# Patient Record
Sex: Male | Born: 1980 | Race: White | Hispanic: No | Marital: Single | Smoking: Current every day smoker
Health system: Southern US, Community
[De-identification: ages and names within clinical notes are randomized; demographics above are authoritative.]

## PROBLEM LIST (undated history)

## (undated) DIAGNOSIS — I10 Essential (primary) hypertension: Secondary | ICD-10-CM

## (undated) DIAGNOSIS — E7800 Pure hypercholesterolemia, unspecified: Secondary | ICD-10-CM

---

## 2004-06-16 ENCOUNTER — Other Ambulatory Visit: Payer: Self-pay

## 2004-06-16 ENCOUNTER — Emergency Department: Payer: Self-pay | Admitting: Emergency Medicine

## 2005-05-11 ENCOUNTER — Emergency Department: Payer: Self-pay | Admitting: Unknown Physician Specialty

## 2006-01-04 ENCOUNTER — Emergency Department: Payer: Self-pay | Admitting: Emergency Medicine

## 2007-06-03 ENCOUNTER — Emergency Department: Payer: Self-pay | Admitting: Emergency Medicine

## 2007-06-05 ENCOUNTER — Other Ambulatory Visit: Payer: Self-pay

## 2007-06-05 ENCOUNTER — Emergency Department: Payer: Self-pay | Admitting: Emergency Medicine

## 2007-07-01 ENCOUNTER — Emergency Department: Payer: Self-pay | Admitting: Unknown Physician Specialty

## 2007-08-07 ENCOUNTER — Emergency Department: Payer: Self-pay | Admitting: Emergency Medicine

## 2008-01-21 ENCOUNTER — Emergency Department: Payer: Self-pay | Admitting: Emergency Medicine

## 2008-02-08 ENCOUNTER — Emergency Department: Payer: Self-pay | Admitting: Internal Medicine

## 2008-04-22 ENCOUNTER — Inpatient Hospital Stay: Payer: Self-pay | Admitting: Unknown Physician Specialty

## 2008-04-22 ENCOUNTER — Inpatient Hospital Stay: Payer: Self-pay | Admitting: Internal Medicine

## 2008-06-16 ENCOUNTER — Emergency Department: Payer: Self-pay | Admitting: Emergency Medicine

## 2008-07-11 ENCOUNTER — Emergency Department: Payer: Self-pay | Admitting: Emergency Medicine

## 2009-08-24 ENCOUNTER — Emergency Department: Payer: Self-pay | Admitting: Unknown Physician Specialty

## 2010-07-02 ENCOUNTER — Emergency Department: Payer: Self-pay | Admitting: Emergency Medicine

## 2011-03-20 ENCOUNTER — Emergency Department: Payer: Self-pay | Admitting: *Deleted

## 2011-04-12 ENCOUNTER — Emergency Department: Payer: Self-pay | Admitting: Emergency Medicine

## 2011-07-19 ENCOUNTER — Emergency Department: Payer: Self-pay | Admitting: Emergency Medicine

## 2011-07-19 LAB — DRUG SCREEN, URINE
Amphetamines, Ur Screen: NEGATIVE (ref ?–1000)
Cannabinoid 50 Ng, Ur ~~LOC~~: POSITIVE (ref ?–50)
Cocaine Metabolite,Ur ~~LOC~~: NEGATIVE (ref ?–300)
MDMA (Ecstasy)Ur Screen: NEGATIVE (ref ?–500)

## 2011-07-19 LAB — CBC
HGB: 14.4 g/dL (ref 13.0–18.0)
MCH: 31.7 pg (ref 26.0–34.0)
MCHC: 33.7 g/dL (ref 32.0–36.0)
RBC: 4.56 10*6/uL (ref 4.40–5.90)
RDW: 13.4 % (ref 11.5–14.5)
WBC: 13.6 10*3/uL — ABNORMAL HIGH (ref 3.8–10.6)

## 2011-07-19 LAB — URINALYSIS, COMPLETE
Bacteria: NONE SEEN
Bilirubin,UR: NEGATIVE
Blood: NEGATIVE
Glucose,UR: NEGATIVE mg/dL (ref 0–75)
Ketone: NEGATIVE
Leukocyte Esterase: NEGATIVE
Protein: NEGATIVE
RBC,UR: NONE SEEN /HPF (ref 0–5)
Specific Gravity: 1.003 (ref 1.003–1.030)
Squamous Epithelial: NONE SEEN

## 2011-07-19 LAB — ETHANOL: Ethanol: <3 mg/dL

## 2011-07-19 LAB — COMPREHENSIVE METABOLIC PANEL
Albumin: 4.6 g/dL (ref 3.4–5.0)
Anion Gap: 7 (ref 7–16)
BUN: 11 mg/dL (ref 7–18)
Bilirubin,Total: 0.3 mg/dL (ref 0.2–1.0)
Calcium, Total: 9.3 mg/dL (ref 8.5–10.1)
Chloride: 106 mmol/L (ref 98–107)
Co2: 26 mmol/L (ref 21–32)
Creatinine: 0.67 mg/dL (ref 0.60–1.30)
EGFR (Non-African Amer.): >60
Osmolality: 277 (ref 275–301)
Potassium: 3.9 mmol/L (ref 3.5–5.1)
SGOT(AST): 27 U/L (ref 15–37)
Sodium: 139 mmol/L (ref 136–145)
Total Protein: 8 g/dL (ref 6.4–8.2)

## 2011-07-19 LAB — TSH: Thyroid Stimulating Horm: 0.605 u[IU]/mL

## 2011-07-19 LAB — SALICYLATE LEVEL: Salicylates, Serum: 4.1 mg/dL — ABNORMAL HIGH

## 2011-07-19 LAB — ACETAMINOPHEN LEVEL: Acetaminophen: <2 ug/mL

## 2011-10-26 ENCOUNTER — Emergency Department: Payer: Self-pay | Admitting: Emergency Medicine

## 2011-12-15 ENCOUNTER — Ambulatory Visit: Payer: Self-pay | Admitting: Family Medicine

## 2012-02-18 ENCOUNTER — Ambulatory Visit: Payer: Self-pay

## 2012-07-14 ENCOUNTER — Ambulatory Visit: Payer: Self-pay | Admitting: Emergency Medicine

## 2014-06-22 ENCOUNTER — Emergency Department: Payer: Self-pay | Admitting: Emergency Medicine

## 2014-08-11 NOTE — Consult Note (Signed)
PATIENT NAME:  Dylan SellarERRY, Hollister L MR#:  [id number] DATE OF BIRTH:  1980/04/26  DATE OF CONSULTATION:  06/23/2014  REFERRING PHYSICIAN:   CONSULTING PHYSICIAN:  Malyiah Fellows K. Guss Bundehalla, MD  PL[facility] DICTATION[facility] EMERGENCY ROOM, BHU-3   SUBJECTIVE: The patient [facility]. Staff reported that he had been cooperative. The patient reports that he already feels better since he had his lithium carbonate 300 mg last night. This appears to be rather a placebo effect. The patient is eager to go home and go back to his girlfriend who is a Teacher, early years/prepharmacist. The patient  will be given lithium carbonate tonight, tonight's dose right now, and that he will be given an appointment back to see [facility] staff tomorrow; that is, 06/24/2014, and he should go and get his prescription for lithium. I asked them to write it for generic as he is not able to afford brand name medications and he agreed to the same.  OBJECTIVE: Dressed in hospital scrubs. Alert and oriented, calm, pleasant and cooperative. Cheerful and smiling. Had a good night's rest. Appetite is good. Does not appear to be responding to internal stimuli. Denies suicidal or homicidal plans. Denies feeling hopeless or helpless. Insight and judgment fair and adequate. Contracts for safety and eager to go home.  IMPRESSION: Bipolar disorder, depressed. The patient is feeling better on lithium carbonate 300 mg at bedtime, though he got only 1 dose. He is eager to go home.  PLAN: Discontinue IBC involuntary commitment and discharge home and the patient will be given an appointment to see the mental health clinic staff tomorrow; that is, 06/24/2014.  ____________________________ Jannet MantisSurya K. Guss Bundehalla, MD skc:lm D: 06/23/2014 16:14:00 ET T: 06/23/2014 22:03:04 ET JOB#: [id number]  cc: Monika SalkSurya K. Guss Bundehalla, MD, <Dictator> Beau FannySURYA K Stoy Fenn MD ELECTRONICALLY SIGNED 06/29/2014 16:13

## 2014-08-11 NOTE — Consult Note (Signed)
PATIENT NAME:  Dylan Hayes, Dylan Hayes DATE OF BIRTH:  03-17-1981  DATE OF CONSULTATION:  06/22/2014  CONSULTING PHYSICIAN:  Allanna Bresee K. Guss Bundehalla, MD  [facility] DI[facility]ATION[facility] [place].  AGE: 34 years.  SEX: Male.  RACE: White.  SUBJECTIVE: The patient was seen in consultation at [facility], BHU-3. 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 The patient is single, never married, and has a girlfriend who is 33 years old and employed as a LawyerCNA at hospice, and they live together. The patient comes to [facility] Emergency Room with the chief complaint "I was on lithium 300 mg at bedtime and it levels me off and makes me feel fine, and I'm not on that."  HISTORY OF PRESENT ILLNESS: The patient reports that he is being followed at [facility] and he has been on Celexa and Abilify, which do not help him as much, and lithium helped him the best.   ALCOHOL AND DRUGS: Does not have problems with alcohol drinking. Does admit smoking THC at the rate of a bowl a day and whenever he can afford and have it. Does admit smoking nicotine cigarettes at a rate of 1/2 pack a day for many years.   MENTAL STATUS: The patient was seen lying in bed, alert and oriented. Calm, pleasant, and cooperative, no agitation. Affect is appropriate with his mood, which is low and down. And admits he feels low and down when he is not on lithium, and lithium really levels his mind off. Does not appear to be responding to internal stimuli. Cognition intact. General information is fair. Denies suicidal or homicidal plans. Insight and judgment guarded. Impulse control is fair.   IMPRESSION: Bipolar disorder and depressed.  RECOMMENDATIONS: Start the patient on lithium 300 mg SA at bedtime. We will see how the patient does and consider for discharge tomorrow, that is  06/23/2014, if the patient continues to stay stable and rests by tonight. We will refer him for appropriate followup appointment on Monday morning with outpatient psychiatry.   ____________________________ Jannet MantisSurya K. Guss Bundehalla, MD skc:mw D: 06/22/2014 17:18:00 ET T: 06/22/2014 17:49:15 ET JOB#: [id number]  cc: Monika SalkSurya K. Guss Bundehalla, MD, <Dictator> Beau FannySURYA K Kaitlan Bin MD ELECTRONICALLY SIGNED 06/23/2014 16:35

## 2017-08-01 ENCOUNTER — Emergency Department
Admission: EM | Admit: 2017-08-01 | Discharge: 2017-08-01 | Disposition: A | Discharge status: Home / Self Care | Payer: Medicaid Other | Attending: Emergency Medicine | Admitting: Emergency Medicine

## 2017-08-01 ENCOUNTER — Encounter: Payer: Self-pay | Admitting: Emergency Medicine

## 2017-08-01 ENCOUNTER — Other Ambulatory Visit: Payer: Self-pay

## 2017-08-01 ENCOUNTER — Emergency Department: Payer: Medicaid Other

## 2017-08-01 DIAGNOSIS — Y929 Unspecified place or not applicable: Secondary | ICD-10-CM | POA: Insufficient documentation

## 2017-08-01 DIAGNOSIS — Y999 Unspecified external cause status: Secondary | ICD-10-CM | POA: Insufficient documentation

## 2017-08-01 DIAGNOSIS — F1210 Cannabis abuse, uncomplicated: Secondary | ICD-10-CM | POA: Insufficient documentation

## 2017-08-01 DIAGNOSIS — R45851 Suicidal ideations: Secondary | ICD-10-CM | POA: Diagnosis not present

## 2017-08-01 DIAGNOSIS — Y9355 Activity, bike riding: Secondary | ICD-10-CM | POA: Diagnosis not present

## 2017-08-01 DIAGNOSIS — Z79899 Other long term (current) drug therapy: Secondary | ICD-10-CM | POA: Insufficient documentation

## 2017-08-01 DIAGNOSIS — F329 Major depressive disorder, single episode, unspecified: Secondary | ICD-10-CM | POA: Insufficient documentation

## 2017-08-01 DIAGNOSIS — F17200 Nicotine dependence, unspecified, uncomplicated: Secondary | ICD-10-CM | POA: Insufficient documentation

## 2017-08-01 DIAGNOSIS — M25511 Pain in right shoulder: Secondary | ICD-10-CM

## 2017-08-01 DIAGNOSIS — F1994 Other psychoactive substance use, unspecified with psychoactive substance-induced mood disorder: Secondary | ICD-10-CM | POA: Insufficient documentation

## 2017-08-01 DIAGNOSIS — S4991XA Unspecified injury of right shoulder and upper arm, initial encounter: Secondary | ICD-10-CM

## 2017-08-01 DIAGNOSIS — F331 Major depressive disorder, recurrent, moderate: Secondary | ICD-10-CM | POA: Diagnosis not present

## 2017-08-01 DIAGNOSIS — F4010 Social phobia, unspecified: Secondary | ICD-10-CM

## 2017-08-01 DIAGNOSIS — F32A Depression, unspecified: Secondary | ICD-10-CM

## 2017-08-01 LAB — URINE DRUG SCREEN, QUALITATIVE (ARMC ONLY)
Amphetamines, Ur Screen: POSITIVE — AB
Barbiturates, Ur Screen: NOT DETECTED
Benzodiazepine, Ur Scrn: NOT DETECTED
Cannabinoid 50 Ng, Ur ~~LOC~~: POSITIVE — AB
Cocaine Metabolite,Ur ~~LOC~~: NOT DETECTED
MDMA (Ecstasy)Ur Screen: NOT DETECTED
Methadone Scn, Ur: NOT DETECTED
Opiate, Ur Screen: NOT DETECTED
Phencyclidine (PCP) Ur S: NOT DETECTED
Tricyclic, Ur Screen: NOT DETECTED

## 2017-08-01 LAB — CBC
HCT: 40.3 % (ref 40.0–52.0)
Hemoglobin: 13.9 g/dL (ref 13.0–18.0)
MCH: 31.2 pg (ref 26.0–34.0)
MCHC: 34.6 g/dL (ref 32.0–36.0)
MCV: 90.2 fL (ref 80.0–100.0)
Platelets: 300 K/uL (ref 150–440)
RBC: 4.47 MIL/uL (ref 4.40–5.90)
RDW: 13 % (ref 11.5–14.5)
WBC: 14.3 K/uL — ABNORMAL HIGH (ref 3.8–10.6)

## 2017-08-01 LAB — COMPREHENSIVE METABOLIC PANEL WITH GFR
ALT: 16 U/L — ABNORMAL LOW (ref 17–63)
AST: 22 U/L (ref 15–41)
Albumin: 4.9 g/dL (ref 3.5–5.0)
Alkaline Phosphatase: 66 U/L (ref 38–126)
Anion gap: 8 (ref 5–15)
BUN: 12 mg/dL (ref 6–20)
CO2: 27 mmol/L (ref 22–32)
Calcium: 9.5 mg/dL (ref 8.9–10.3)
Chloride: 102 mmol/L (ref 101–111)
Creatinine, Ser: 0.58 mg/dL — ABNORMAL LOW (ref 0.61–1.24)
GFR calc Af Amer: >60 mL/min
GFR calc non Af Amer: >60 mL/min
Glucose, Bld: 130 mg/dL — ABNORMAL HIGH (ref 65–99)
Potassium: 3.6 mmol/L (ref 3.5–5.1)
Sodium: 137 mmol/L (ref 135–145)
Total Bilirubin: 0.4 mg/dL (ref 0.3–1.2)
Total Protein: 8.1 g/dL (ref 6.5–8.1)

## 2017-08-01 LAB — ETHANOL: Alcohol, Ethyl (B): <10 mg/dL (ref <10)

## 2017-08-01 LAB — SALICYLATE LEVEL: Salicylate Lvl: <7 mg/dL (ref 2.8–30.0)

## 2017-08-01 LAB — ACETAMINOPHEN LEVEL: Acetaminophen (Tylenol), Serum: <10 ug/mL — ABNORMAL LOW (ref 10–30)

## 2017-08-01 MED ORDER — IBUPROFEN 800 MG PO TABS
800.0000 mg | ORAL_TABLET | Freq: Once | ORAL | Status: AC
  Administered 2017-08-01: 800 mg via ORAL
  Filled 2017-08-01: qty 1

## 2017-08-01 MED ORDER — HYDROCODONE-ACETAMINOPHEN 5-325 MG PO TABS
1.0000 | ORAL_TABLET | Freq: Once | ORAL | Status: AC
  Administered 2017-08-01: 1 via ORAL
  Filled 2017-08-01: qty 1

## 2017-08-01 MED ORDER — SERTRALINE HCL 25 MG PO TABS
25.0000 mg | ORAL_TABLET | Freq: Every day | ORAL | Status: DC
  Filled 2017-08-01: qty 1

## 2017-08-01 MED ORDER — FLUOXETINE HCL 20 MG PO CAPS
20.0000 mg | ORAL_CAPSULE | Freq: Every day | ORAL | 1 refills | Status: DC
Start: 2017-08-01 — End: 2019-12-22

## 2017-08-01 NOTE — Consult Note (Signed)
[name] Face-to-Face Psychiatry Consult   Reason for Consult: Consult for 37 year old man who came to the emergency room initially for medical problems but then reported some suicidal thought. Referring Physician: Rip Harbour Patient Identification: Dylan Hayes MRN:  [id number] Principal Diagnosis: Moderate recurrent major depression ([place]) Diagnosis:   Patient Active Problem List   Diagnosis Date Noted  . Moderate recurrent major depression ([place]) [F33.1] 08/01/2017  . Social anxiety disorder [F40.10] 08/01/2017    Total Time spent with patient: 1 hour  Subjective:   Dylan Hayes is a 37 y.o. male patient admitted with "basically to get back on my medicine".  HPI: Patient interviewed chart reviewed.  37 year old man reports that his mood has been depressed and down.  Feels negative and a lot of the time.  Feels like he has trouble getting his thoughts together.  Low motivation.  Cannot hold a job because he cannot concentrate.  Gets very nervous and panicky when he is around crowds of people.  Reports he has had passive suicidal thoughts but no recent intention or plan.  Denies any hallucinations.  Not currently receiving any mental health treatment major stresses.  Has been living homeless for weeks now.  Social history: Has been living intermittently on the street recently.  Cannot keep a job.  Somewhat alienated from his family.  Medical history: No ongoing chronic medical issues  Substance abuse history: Uses marijuana intermittently but denies alcohol use or any other current drug abuse  Past Psychiatric History: Patient has seen psychiatrist in the past.  Last had a hospitalization a few years ago.  Positive past history of a suicide attempt also years ago.  Used to take medication but cannot remember what it was.  Chart suggests it might of been Zoloft.  No history of manic symptoms.  Vague reports of past hallucinations.  Risk to Self: Suicidal Ideation: No-Not Currently/Within Last 6  Months Suicidal Intent: No-Not Currently/Within Last 6 Months Is patient at risk for suicide?: Yes Suicidal Plan?: No-Not Currently/Within Last 6 Months Access to Means: Yes Specify Access to Suicidal Means: Medication What has been your use of drugs/alcohol within the last 12 months?: Cannabis How many times?: 2 Other Self Harm Risks: Reports of none Triggers for Past Attempts: Other personal contacts, Other (Comment) Intentional Self Injurious Behavior: None Risk to Others: Homicidal Ideation: No Thoughts of Harm to Others: No Current Homicidal Intent: No Current Homicidal Plan: No Access to Homicidal Means: No Identified Victim: Reports of none History of harm to others?: No Violent Behavior Description: Reports of none Does patient have access to weapons?: No Criminal Charges Pending?: No Does patient have a court date: No Prior Inpatient Therapy: Prior Inpatient Therapy: Yes Prior Therapy Dates: 04/2008 Prior Therapy Facilty/Provider(s): [facility] Reason for Treatment: Depression Prior Outpatient Therapy: Prior Outpatient Therapy: Yes Prior Therapy Dates: 2013 Prior Therapy Facilty/Provider(s): [facility] Reason for Treatment: Depression Does patient have an ACCT team?: No Does patient have Intensive In-House Services?  : No Does patient have [facility] services? : No Does patient have P4CC services?: No  Past Medical History: History reviewed. No pertinent past medical history. History reviewed. No pertinent surgical history. Family History: No family history on file. Family Psychiatric  History: Positive history of depression in his mother Social History:  Social History   Substance and Sexual Activity  Alcohol Use Not Currently  . Frequency: Never     Social History   Substance and Sexual Activity  Drug Use Yes  . Types: Marijuana  Social History   Socioeconomic History  . Marital status: Single    Spouse name: Not on file  . Number of  children: Not on file  . Years of education: Not on file  . Highest education level: Not on file  Occupational History  . Not on file  Social Needs  . Financial resource strain: Not on file  . Food insecurity:    Worry: Not on file    Inability: Not on file  . Transportation needs:    Medical: Not on file    Non-medical: Not on file  Tobacco Use  . Smoking status: Current Some Day Smoker  Substance and Sexual Activity  . Alcohol use: Not Currently    Frequency: Never  . Drug use: Yes    Types: Marijuana  . Sexual activity: Not on file  Lifestyle  . Physical activity:    Days per week: Not on file    Minutes per session: Not on file  . Stress: Not on file  Relationships  . Social connections:    Talks on phone: Not on file    Gets together: Not on file    Attends religious service: Not on file    Active member of club or organization: Not on file    Attends meetings of clubs or organizations: Not on file    Relationship status: Not on file  Other Topics Concern  . Not on file  Social History Narrative  . Not on file   Additional Social History:    Allergies:  No Known Allergies  Labs:  Results for orders placed or performed during the hospital encounter of 08/01/17 (from the past 48 hour(s))  Comprehensive metabolic panel     Status: Abnormal   Collection Time: 08/01/17  4:02 AM  Result Value Ref Range   Sodium 137 135 - 145 mmol/L   Potassium 3.6 3.5 - 5.1 mmol/L   Chloride 102 101 - 111 mmol/L   CO2 27 22 - 32 mmol/L   Glucose, Bld 130 (H) 65 - 99 mg/dL   BUN 12 6 - 20 mg/dL   Creatinine, Ser 0.58 (L) 0.61 - 1.24 mg/dL   Calcium 9.5 8.9 - 10.3 mg/dL   Total Protein 8.1 6.5 - 8.1 g/dL   Albumin 4.9 3.5 - 5.0 g/dL   AST 22 15 - 41 U/L   ALT 16 (L) 17 - 63 U/L   Alkaline Phosphatase 66 38 - 126 U/L   Total Bilirubin 0.4 0.3 - 1.2 mg/dL   GFR calc non Af Amer >60 >60 mL/min   GFR calc Af Amer >60 >60 mL/min    Comment: (NOTE) The eGFR has been calculated  using the CKD EPI equation. This calculation has not been validated in all clinical situations. eGFR's persistently <60 mL/min signify possible Chronic Kidney Disease.    Anion gap 8 5 - 15    Comment: Performed at [facility], [address] [place], [place] [address]  Ethanol     Status: None   Collection Time: 08/01/17  4:02 AM  Result Value Ref Range   Alcohol, Ethyl (B) <10 <10 mg/dL    Comment:        LOWEST DETECTABLE LIMIT FOR SERUM ALCOHOL IS 10 mg/dL FOR MEDICAL PURPOSES ONLY Performed at [facility], [address] [place],  [address]   Salicylate level     Status: None   Collection Time: 08/01/17  4:02 AM  Result Value Ref Range  Salicylate Lvl <6.8 2.8 - 30.0 mg/dL    Comment: Performed at [facility], [address] [place], [place] [address]  Acetaminophen level     Status: Abnormal   Collection Time: 08/01/17  4:02 AM  Result Value Ref Range   Acetaminophen (Tylenol), Serum <10 (L) 10 - 30 ug/mL    Comment:        THERAPEUTIC CONCENTRATIONS VARY SIGNIFICANTLY. A RANGE OF 10-30 ug/mL MAY BE AN EFFECTIVE CONCENTRATION FOR MANY PATIENTS. HOWEVER, SOME ARE BEST TREATED AT CONCENTRATIONS OUTSIDE THIS RANGE. ACETAMINOPHEN CONCENTRATIONS >150 ug/mL AT 4 HOURS AFTER INGESTION AND >50 ug/mL AT 12 HOURS AFTER INGESTION ARE OFTEN ASSOCIATED WITH TOXIC REACTIONS. Performed at [facility], [address] [place], [place] [address]   cbc     Status: Abnormal   Collection Time: 08/01/17  4:02 AM  Result Value Ref Range   WBC 14.3 (H) 3.8 - 10.6 K/uL   RBC 4.47 4.40 - 5.90 MIL/uL   Hemoglobin 13.9 13.0 - 18.0 g/dL   HCT 40.3 40.0 - 52.0 %   MCV 90.2 80.0 - 100.0 fL   MCH 31.2 26.0 - 34.0 pg   MCHC 34.6 32.0 - 36.0 g/dL   RDW 13.0 11.5 - 14.5 %   Platelets 300 150 - 440 K/uL    Comment: Performed at [facility], [address] [place], [place] [address]  Urine Drug Screen, Qualitative     Status:  Abnormal   Collection Time: 08/01/17  4:02 AM  Result Value Ref Range   Tricyclic, Ur Screen NONE DETECTED NONE DETECTED   Amphetamines, Ur Screen POSITIVE (A) NONE DETECTED   MDMA (Ecstasy)Ur Screen NONE DETECTED NONE DETECTED   Cocaine Metabolite,Ur [place] NONE DETECTED NONE DETECTED   Opiate, Ur Screen NONE DETECTED NONE DETECTED   Phencyclidine (PCP) Ur S NONE DETECTED NONE DETECTED   Cannabinoid 50 Ng, Ur [place] POSITIVE (A) NONE DETECTED   Barbiturates, Ur Screen NONE DETECTED NONE DETECTED   Benzodiazepine, Ur Scrn NONE DETECTED NONE DETECTED   Methadone Scn, Ur NONE DETECTED NONE DETECTED    Comment: (NOTE) Tricyclics + metabolites, urine    Cutoff 1000 ng/mL Amphetamines + metabolites, urine  Cutoff 1000 ng/mL MDMA (Ecstasy), urine              Cutoff 500 ng/mL Cocaine Metabolite, urine          Cutoff 300 ng/mL Opiate + metabolites, urine        Cutoff 300 ng/mL Phencyclidine (PCP), urine         Cutoff 25 ng/mL Cannabinoid, urine                 Cutoff 50 ng/mL Barbiturates + metabolites, urine  Cutoff 200 ng/mL Benzodiazepine, urine              Cutoff 200 ng/mL Methadone, urine                   Cutoff 300 ng/mL The urine drug screen provides only a preliminary, unconfirmed analytical test result and should not be used for non-medical purposes. Clinical consideration and professional judgment should be applied to any positive drug screen result due to possible interfering substances. A more specific alternate chemical method must be used in order to obtain a confirmed analytical result. Gas chromatography / mass spectrometry (GC/MS) is the preferred confirmat ory method. Performed at [facility], [address], [place],  [address]     Current Facility-Administered Medications  Medication Dose Route Frequency Provider Last Rate Last Dose  . sertraline (ZOLOFT) tablet 25 mg  25 mg Oral QHS Paulette Blanch, MD       Current Outpatient Medications   Medication Sig Dispense Refill  . buprenorphine-naloxone (SUBOXONE) 8-2 mg SUBL SL tablet Place 1 tablet under the tongue 2 (two) times daily.    Marland Kitchen FLUoxetine (PROZAC) 20 MG capsule Take 1 capsule (20 mg total) by mouth daily. 30 capsule 1    Musculoskeletal: Strength & Muscle Tone: within normal limits Gait & Station: normal Patient leans: N/A  Psychiatric Specialty Exam: Physical Exam  Nursing note and vitals reviewed. Constitutional: He appears well-developed and well-nourished.  HENT:  Head: Normocephalic and atraumatic.  Eyes: Pupils are equal, round, and reactive to light. Conjunctivae are normal.  Neck: Normal range of motion.  Cardiovascular: Regular rhythm and normal heart sounds.  Respiratory: Effort normal. No respiratory distress.  GI: Soft.  Musculoskeletal: Normal range of motion.  Neurological: He is alert.  Skin: Skin is warm and dry.  Psychiatric: Judgment normal. His affect is blunt. His speech is delayed. He is slowed. Thought content is not paranoid. Cognition and memory are normal. He expresses no homicidal and no suicidal ideation.    Review of Systems  Constitutional: Negative.   HENT: Negative.   Eyes: Negative.   Respiratory: Negative.   Cardiovascular: Negative.   Gastrointestinal: Negative.   Musculoskeletal: Negative.   Skin: Negative.   Neurological: Negative.   Psychiatric/Behavioral: Positive for depression. Negative for hallucinations, memory loss, substance abuse and suicidal ideas. The patient is nervous/anxious and has insomnia.     Blood pressure 124/83, pulse 66, temperature 98.5 F (36.9 C), temperature source Oral, resp. rate 19, height '5\' 8"'  (1.727 m), weight 72.6 kg (160 lb), SpO2 99 %.Body mass index is 24.33 kg/m.  General Appearance: Casual  Eye Contact:  Good  Speech:  Clear and Coherent  Volume:  Decreased  Mood:  Anxious and Depressed  Affect:  Blunt  Thought Process:  Goal Directed  Orientation:  Full (Time, Place, and  Person)  Thought Content:  Logical  Suicidal Thoughts:  No  Homicidal Thoughts:  No  Memory:  Immediate;   Fair Recent;   Fair Remote;   Fair  Judgement:  Fair  Insight:  Fair  Psychomotor Activity:  Decreased  Concentration:  Concentration: Fair  Recall:  [employer] of Knowledge:  Fair  Language:  Fair  Akathisia:  No  Handed:  Right  AIMS (if indicated):     Assets:  Desire for Improvement Physical Health Resilience  ADL's:  Impaired  Cognition:  WNL  Sleep:        Treatment Plan Summary: Medication management and Plan 37 year old man who has a history of depression and social anxiety disorder.  Had made some passive suicidal comments earlier but denies that he has any intention or plan of acting on it.  Patient had not has been cooperative and calm in the emergency room.  Does not want to stay in the hospital.  Does not meet commitment criteria.  Discontinue IVC.  Reviewed appropriate treatment and suggested we start him on serotonin reuptake inhibitor using an effective medicine that also will be affordable.  Prozac 20 mg a day prescription with one month refill written.  Patient agreeable.  Side effects reviewed.  Case reviewed with emergency room physician and TTS.  Patient can be discharged with follow-up at [facility].  Disposition: No evidence of imminent risk to self or  others at present.   Patient does not meet criteria for psychiatric inpatient admission. Supportive therapy provided about ongoing stressors. Discussed crisis plan, support from social network, calling 911, coming to the Emergency Department, and calling Suicide Hotline.  Alethia Berthold, MD 08/01/2017 3:51 PM

## 2017-08-01 NOTE — ED Notes (Addendum)
Patient came in for right shoulder injury and then told nurse he was having thoughts of harming himself. Patient states he has been on medication for depression in the past but it has been years since patient has been on anything.

## 2017-08-01 NOTE — ED Notes (Signed)
TTS to bedside. 

## 2017-08-01 NOTE — ED Notes (Signed)
Pt provided lunch tray.

## 2017-08-01 NOTE — ED Notes (Signed)
Patient presents to the ED by himself;"I walked here; I'm homeless x 3 months; I want to get back on my medicine and stuff; I have suicidal thoughts; I always have suicidal thoughts; I'm not getting  therapy."
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
## 2017-08-01 NOTE — Discharge Instructions (Addendum)
Take your Prozac as prescribed.  Follow-up with [facility] for your depression.  You can use Motrin 3 of the over-the-counter pills 3 times a day for the pain in the shoulder.  Please also follow-up with orthopedic surgery Dr. Hyacinth MeekerMiller.  Return for any further problems.

## 2017-08-01 NOTE — ED Notes (Signed)
Patient is currently speaking with [facility]. 

## 2017-08-01 NOTE — ED Notes (Signed)
Psychiatry to bedside at this time. 
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
## 2019-05-18 ENCOUNTER — Ambulatory Visit: Payer: Medicaid Other | Attending: Internal Medicine

## 2019-05-18 DIAGNOSIS — Z20822 Contact with and (suspected) exposure to covid-19: Secondary | ICD-10-CM

## 2019-05-19 LAB — NOVEL CORONAVIRUS, NAA: SARS-CoV-2, NAA: DETECTED — AB

## 2019-05-24 ENCOUNTER — Telehealth: Payer: Self-pay

## 2019-05-24 NOTE — Telephone Encounter (Signed)
Patient called and left message on voicemail to return call.

## 2019-05-24 NOTE — Telephone Encounter (Signed)
Pt returning call regarding his covid test results. Will have nurse call back.   Joycelyn Rua Hopkins

## 2019-06-04 ENCOUNTER — Telehealth: Payer: Self-pay | Admitting: *Deleted

## 2019-06-04 NOTE — Telephone Encounter (Signed)
Patient was diagnosed with COVID on 2/10 and patient had exposure with symptoms: cough, headache, breathing difficulty and congestion. Patient has had improvement- but he still has symptoms. Patient has family who has now tested + COVID as well. Patient has been contacted by the Health dept.  Advised patient if he needs documentation for work/court- he may need to be seen by virtual visit/UC for extension of isolation due to symptoms. Patient will contact health dept for more documentation and he has lab print out with recommendations for MyChart-. Patient voices understanding.
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
## 2019-12-22 ENCOUNTER — Ambulatory Visit
Admission: EM | Admit: 2019-12-22 | Discharge: 2019-12-22 | Disposition: A | Discharge status: Home / Self Care | Payer: Medicaid Other | Attending: Family | Admitting: Family

## 2019-12-22 ENCOUNTER — Encounter: Payer: Self-pay | Admitting: Emergency Medicine

## 2019-12-22 ENCOUNTER — Other Ambulatory Visit: Payer: Self-pay

## 2019-12-22 DIAGNOSIS — Z882 Allergy status to sulfonamides status: Secondary | ICD-10-CM | POA: Insufficient documentation

## 2019-12-22 DIAGNOSIS — Z79899 Other long term (current) drug therapy: Secondary | ICD-10-CM | POA: Insufficient documentation

## 2019-12-22 DIAGNOSIS — R030 Elevated blood-pressure reading, without diagnosis of hypertension: Secondary | ICD-10-CM | POA: Insufficient documentation

## 2019-12-22 DIAGNOSIS — R519 Headache, unspecified: Secondary | ICD-10-CM

## 2019-12-22 DIAGNOSIS — R059 Cough, unspecified: Secondary | ICD-10-CM

## 2019-12-22 DIAGNOSIS — R110 Nausea: Secondary | ICD-10-CM | POA: Diagnosis not present

## 2019-12-22 DIAGNOSIS — F17210 Nicotine dependence, cigarettes, uncomplicated: Secondary | ICD-10-CM | POA: Insufficient documentation

## 2019-12-22 DIAGNOSIS — Z20822 Contact with and (suspected) exposure to covid-19: Secondary | ICD-10-CM | POA: Diagnosis not present

## 2019-12-22 DIAGNOSIS — R05 Cough: Secondary | ICD-10-CM | POA: Diagnosis not present

## 2019-12-22 DIAGNOSIS — R0981 Nasal congestion: Secondary | ICD-10-CM | POA: Diagnosis not present

## 2019-12-22 MED ORDER — DICLOFENAC SODIUM 75 MG PO TBEC: 75.0000 mg | DELAYED_RELEASE_TABLET | Freq: Two times a day (BID) | ORAL | 0 refills | Status: AC | PRN

## 2019-12-22 MED ORDER — ONDANSETRON HCL 4 MG PO TABS: 4.0000 mg | ORAL_TABLET | Freq: Three times a day (TID) | ORAL | 0 refills | Status: AC | PRN

## 2019-12-22 NOTE — ED Triage Notes (Signed)
Patient c/o HAs, bodyaches, cough, and nasal congestion for 4 days.  Patient denies fevers.

## 2019-12-22 NOTE — ED Provider Notes (Signed)
[facility]    CSN: [id number] Arrival date & time: 12/22/19  1348      History   Chief Complaint Chief Complaint  Patient presents with  . Headache  . Cough  . Generalized Body Aches    HPI Dylan Hayes is a 39 y.o. male.   39 year old male presents with nasal congestion, cough, body aches and headache for the past 4 days. Denies any fever but has felt "warm". Also having some nausea but no sore throat, vomiting or diarrhea. Has taken Ibuprofen 800mg  and Fioricet for the headache with minimal relief. He has [place] about 7 [place]. He has not been vaccinated. He has previously tested positive and had symptoms of  COVID 19 in February 2021. Other chronic health issues include substance use disorder and currently on Suboxone daily.   The history is provided by the patient.    History reviewed. No pertinent past medical history.  Patient Active Problem List   Diagnosis Date Noted  . Moderate recurrent major depression (HCC) 08/01/2017  . Social anxiety disorder 08/01/2017    History reviewed. No pertinent surgical history.     Home Medications    Prior to Admission medications   Medication Sig Start Date End Date Taking? Authorizing Provider  buprenorphine-naloxone (SUBOXONE) 8-2 mg SUBL SL tablet Place 1 tablet under the tongue 2 (two) times daily.   Yes [provider]  diclofenac (VOLTAREN) 75 MG EC tablet Take 1 tablet (75 mg total) by mouth every 12 (twelve) hours as needed for moderate pain. 12/22/19   02/21/20, NP  ondansetron (ZOFRAN) 4 MG tablet Take 1 tablet (4 mg total) by mouth every 8 (eight) hours as needed for nausea. 12/22/19   02/21/20, NP  FLUoxetine (PROZAC) 20 MG capsule Take 1 capsule (20 mg total) by mouth daily. 08/01/17 12/22/19  Clapacs, 02/21/20, MD    Family History History reviewed. No pertinent family history.  Social History Social History   Tobacco  Use  . Smoking status: Current Some Day Smoker    Packs/day: 0.50    Types: Cigarettes  . Smokeless tobacco: Never Used  Vaping Use  . Vaping Use: Never used  Substance Use Topics  . Alcohol use: Not Currently  . Drug use: Not Currently    Types: Marijuana     Allergies   Sulfa antibiotics   Review of Systems Review of Systems  Constitutional: Positive for activity change, appetite change, chills and fatigue. Negative for fever.  HENT: Positive for congestion, postnasal drip, rhinorrhea and sinus pressure. Negative for ear discharge, ear pain, facial swelling, mouth sores, nosebleeds, sinus pain, sore throat and trouble swallowing.   Eyes: Negative for pain, discharge, redness and itching.  Respiratory: Positive for cough. Negative for chest tightness and wheezing.   Gastrointestinal: Positive for nausea. Negative for diarrhea and vomiting.  Musculoskeletal: Positive for arthralgias and myalgias. Negative for neck pain and neck stiffness.  Skin: Negative for color change, rash and wound.  Allergic/Immunologic: Negative for environmental allergies, food allergies and immunocompromised state.  Neurological: Positive for headaches. Negative for dizziness, seizures, syncope, weakness, light-headedness and numbness.  Hematological: Negative for adenopathy. Does not bruise/bleed easily.     Physical Exam Triage Vital Signs ED Triage Vitals  Enc Vitals Group     BP 12/22/19 1414 (!) 153/105     Pulse Rate 12/22/19 1414 85  Resp 12/22/19 1414 16     Temp 12/22/19 1414 98.1 F (36.7 C)     Temp Source 12/22/19 1414 Oral     SpO2 12/22/19 1414 99 %     Weight 12/22/19 1410 174 lb (78.9 kg)     Height 12/22/19 1410 5\' 8"  (1.727 m)     Head Circumference --      Peak Flow --      Pain Score 12/22/19 1410 6     Pain Loc --      Pain Edu? --      Excl. in GC? --    No data found.  Updated Vital Signs BP (!) 153/105 (BP Location: Right Arm)   Pulse 85   Temp 98.1 F  (36.7 C) (Oral)   Resp 16   Ht 5\' 8"  (1.727 m)   Wt 174 lb (78.9 kg)   SpO2 99%   BMI 26.46 kg/m   Visual Acuity Right Eye Distance:   Left Eye Distance:   Bilateral Distance:    Right Eye Near:   Left Eye Near:    Bilateral Near:     Physical Exam Vitals and nursing note reviewed.  Constitutional:      General: He is awake. He is not in acute distress.    Appearance: He is well-developed and well-groomed. He is ill-appearing.     Comments: He is sitting on the exam table in no acute distress but appears tired and ill.   HENT:     Head: Normocephalic and atraumatic.     Right Ear: Hearing, tympanic membrane, ear canal and external ear normal.     Left Ear: Hearing, tympanic membrane, ear canal and external ear normal.     Nose: Septal deviation and congestion present.     Right Sinus: No maxillary sinus tenderness or frontal sinus tenderness.     Left Sinus: No maxillary sinus tenderness or frontal sinus tenderness.     Mouth/Throat:     Lips: Pink.     Mouth: Mucous membranes are moist.     Pharynx: Oropharynx is clear. Uvula midline. Posterior oropharyngeal erythema present. No pharyngeal swelling, oropharyngeal exudate or uvula swelling.  Eyes:     Extraocular Movements: Extraocular movements intact.     Conjunctiva/sclera: Conjunctivae normal.     Pupils: Pupils are equal, round, and reactive to light.  Cardiovascular:     Rate and Rhythm: Normal rate and regular rhythm.     Heart sounds: Normal heart sounds. No murmur heard.   Pulmonary:     Effort: Pulmonary effort is normal. No respiratory distress.     Breath sounds: Normal breath sounds and air entry. No decreased air movement. No decreased breath sounds, wheezing, rhonchi or rales.  Musculoskeletal:        General: Normal range of motion.     Cervical back: Normal range of motion and neck supple. No rigidity.  Lymphadenopathy:     Cervical: No cervical adenopathy.  Skin:    General: Skin is warm and dry.       Capillary Refill: Capillary refill takes less than 2 seconds.     Findings: No rash.  Neurological:     General: No focal deficit present.     Mental Status: He is alert and oriented to person, place, and time.  Psychiatric:        Mood and Affect: Mood normal.        Behavior: Behavior normal. Behavior is cooperative.  Thought Content: Thought content normal.        Judgment: Judgment normal.      UC Treatments / Results  Labs (all labs ordered are listed, but only abnormal results are displayed) Labs Reviewed  SARS CORONAVIRUS 2 (TAT 6-24 HRS)    EKG   Radiology No results found.  Procedures Procedures (including critical care time)  Medications Ordered in UC Medications - No data to display  Initial Impression / Assessment and Plan / UC Course  I have reviewed the triage vital signs and the nursing notes.  Pertinent labs & imaging results that were available during my care of the patient were reviewed by me and considered in my medical decision making (see chart for details).    Reviewed with patient that he probably has a viral illness which is causing the headache. May trial Voltaren 75mg  twice a day as directed for headache. May also take Zofran 4mg  every 8 hours as needed for nausea. Increase fluid intake to help loosen up mucus in sinuses and chest. Rest. Stay at home. Note written for work. Also briefly discussed elevated blood pressure reading. Recommend continue to monitor and see his PCP if BP remains >140/>90. Follow-up pending COVID 19 test results.   Final Clinical Impressions(s) / UC Diagnoses   Final diagnoses:  Nasal congestion  Acute intractable headache, unspecified headache type  Cough  Nausea without vomiting  Elevated blood-pressure reading without diagnosis of hypertension     Discharge Instructions     Recommend start Voltaren 75mg  twice a day as needed for headache. May take Zofran 4mg  every 8 hours as needed for nausea.  Continue to push fluids to stay hydrated and to help loosen up mucus in sinuses and chest. Rest. Stay at home. Follow-up pending COVID 19 test results.     ED Prescriptions    Medication Sig Dispense Auth. Provider   diclofenac (VOLTAREN) 75 MG EC tablet Take 1 tablet (75 mg total) by mouth every 12 (twelve) hours as needed for moderate pain. 20 tablet , NP   ondansetron (ZOFRAN) 4 MG tablet Take 1 tablet (4 mg total) by mouth every 8 (eight) hours as needed for nausea. 15 tablet Emy Angevine, , NP     PDMP not reviewed this encounter.   , NP 12/23/19 [phone]

## 2019-12-22 NOTE — Discharge Instructions (Addendum)
Recommend start Voltaren 75mg  twice a day as needed for headache. May take Zofran 4mg  every 8 hours as needed for nausea. Continue to push fluids to stay hydrated and to help loosen up mucus in sinuses and chest. Rest. Stay at home. Follow-up pending COVID 19 test results.

## 2019-12-23 LAB — SARS CORONAVIRUS 2 (TAT 6-24 HRS): SARS Coronavirus 2: NEGATIVE

## 2023-05-03 ENCOUNTER — Other Ambulatory Visit: Payer: Self-pay

## 2023-05-03 ENCOUNTER — Emergency Department
Admission: EM | Admit: 2023-05-03 | Discharge: 2023-05-03 | Discharge status: Against Medical Advice | Payer: Medicaid Other | Attending: Emergency Medicine | Admitting: Emergency Medicine

## 2023-05-03 ENCOUNTER — Emergency Department: Payer: Medicaid Other

## 2023-05-03 DIAGNOSIS — Z5321 Procedure and treatment not carried out due to patient leaving prior to being seen by health care provider: Secondary | ICD-10-CM | POA: Diagnosis not present

## 2023-05-03 DIAGNOSIS — M25571 Pain in right ankle and joints of right foot: Secondary | ICD-10-CM | POA: Diagnosis present

## 2023-05-03 DIAGNOSIS — W19XXXA Unspecified fall, initial encounter: Secondary | ICD-10-CM | POA: Diagnosis not present

## 2023-05-03 NOTE — ED Triage Notes (Signed)
Pt reports he fell tonight injuring right ankle, pt reports pain and swelling to area.

## 2023-05-17 ENCOUNTER — Emergency Department: Payer: Medicaid Other

## 2023-05-17 ENCOUNTER — Other Ambulatory Visit: Payer: Self-pay

## 2023-05-17 ENCOUNTER — Emergency Department
Admission: EM | Admit: 2023-05-17 | Discharge: 2023-05-17 | Disposition: A | Discharge status: Home / Self Care | Payer: Medicaid Other | Attending: Emergency Medicine | Admitting: Emergency Medicine

## 2023-05-17 DIAGNOSIS — R0789 Other chest pain: Secondary | ICD-10-CM | POA: Insufficient documentation

## 2023-05-17 DIAGNOSIS — I10 Essential (primary) hypertension: Secondary | ICD-10-CM | POA: Diagnosis not present

## 2023-05-17 DIAGNOSIS — R079 Chest pain, unspecified: Secondary | ICD-10-CM | POA: Diagnosis present

## 2023-05-17 HISTORY — DX: Essential (primary) hypertension: I10

## 2023-05-17 LAB — TROPONIN I (HIGH SENSITIVITY)
Troponin I (High Sensitivity): 4 ng/L (ref <18)
Troponin I (High Sensitivity): 4 ng/L (ref <18)

## 2023-05-17 LAB — CBC
HCT: 34.5 % — ABNORMAL LOW (ref 39.0–52.0)
Hemoglobin: 11.5 g/dL — ABNORMAL LOW (ref 13.0–17.0)
MCH: 29.9 pg (ref 26.0–34.0)
MCHC: 33.3 g/dL (ref 30.0–36.0)
MCV: 89.6 fL (ref 80.0–100.0)
Platelets: 471 10*3/uL — ABNORMAL HIGH (ref 150–400)
RBC: 3.85 MIL/uL — ABNORMAL LOW (ref 4.22–5.81)
RDW: 12.3 % (ref 11.5–15.5)
WBC: 9.3 10*3/uL (ref 4.0–10.5)
nRBC: 0 % (ref 0.0–0.2)

## 2023-05-17 LAB — BASIC METABOLIC PANEL
Anion gap: 9 (ref 5–15)
BUN: 14 mg/dL (ref 6–20)
CO2: 26 mmol/L (ref 22–32)
Calcium: 9.2 mg/dL (ref 8.9–10.3)
Chloride: 103 mmol/L (ref 98–111)
Creatinine, Ser: 0.63 mg/dL (ref 0.61–1.24)
GFR, Estimated: >60 mL/min (ref >60)
Glucose, Bld: 105 mg/dL — ABNORMAL HIGH (ref 70–99)
Potassium: 4.2 mmol/L (ref 3.5–5.1)
Sodium: 138 mmol/L (ref 135–145)

## 2023-05-17 MED ORDER — ONDANSETRON HCL 4 MG/2ML IJ SOLN
4.0000 mg | Freq: Once | INTRAMUSCULAR | Status: AC
  Administered 2023-05-17: 4 mg via INTRAVENOUS
  Filled 2023-05-17: qty 2

## 2023-05-17 MED ORDER — SODIUM CHLORIDE 0.9 % IV BOLUS
1000.0000 mL | Freq: Once | INTRAVENOUS | Status: AC
  Administered 2023-05-17: 1000 mL via INTRAVENOUS

## 2023-05-17 NOTE — ED Triage Notes (Addendum)
 Pt to ED for mid sternal non-radiating chest pressure since 1.5 hours ago, to ED via [facility], pt was driving and began having chest pressure so pulled over. Pt took OTC med this AM around 5am for sexual endurance. Pt is current smoker. Was ST on EMS EKG. Pt also has broken foot and is using crutches.  Pt was given 1 NTG SL and 1 inch NTG paste. Pt refused aspirin with EMS because gets rash with aspirin.  Pt is complaining of dizziness and nausea. EKG is normal but pt is diaphoretic and pale. NTG paste removed from chest and 1L bolus started, has 18# EMS IV. Blue top sent with labs.  PA in triage.  Counseled pt not to get up by self in lobby.

## 2023-05-17 NOTE — Discharge Instructions (Signed)
Your tests today were all reassuring. Please drink lots of water to stay well-hydrated and follow up with cardiology for further evaluation of your symptoms.

## 2023-05-17 NOTE — ED Provider Triage Note (Signed)
 Emergency Medicine Provider Triage Evaluation Note  Dylan Hayes , a 43 y.o. male  was evaluated in triage.  Pt complains of mid sternal cp 1.5 hours pta.  Patient was driving and had chest pressure and pulled over.  EMS gave 1 NTG and 1 inch NTG paste.   Denies prior cardiac history  Review of Systems  Positive: +dizziness, nausea,  Negative:   Physical Exam  Ht 5' 8 (1.727 m)   Wt 77.6 kg   BMI 26.00 kg/m  Gen:   Awake, no distress  given emesis bag Resp:  Normal effort  Lungs clear  MSK:   Moves extremities without difficulty  Other:    Medical Decision Making  Medically screening exam initiated at 1:54 PM.  Appropriate orders placed.  Dylan Hayes was informed that the remainder of the evaluation will be completed by another provider, this initial triage assessment does not replace that evaluation, and the importance of remaining in the ED until their evaluation is complete.     Dylan Shona LITTIE, PA-C 05/17/23 1357

## 2023-05-17 NOTE — ED Provider Notes (Signed)
 [facility] Provider Note    Event Date/Time   First MD Initiated Contact with Patient 05/17/23 1501     (approximate)   History   Chief Complaint: Chest Pain and Dizziness   HPI  Dylan Hayes is a 43 y.o. male with a history of anxiety, depression, hypertension, hyperlipidemia who comes to the ED complaining of chest tightness.  Patient reports being in his usual state of health, taking an over-the-counter sexual health supplement this morning, at around noon while driving started having central chest tightness.  Nonradiating.  No diaphoresis shortness of breath or vomiting, no dizziness or syncope.  EMS arrived, gave the patient sublingual nitroglycerin and Nitropaste, after which patient developed low blood pressure.  Nitropaste was removed on arrival to the ED.  Currently, patient reports he is feeling better.  Denies any recent exertional symptoms.  Not pleuritic.  No aggravating or alleviating factors.          Physical Exam   Triage Vital Signs: ED Triage Vitals  Encounter Vitals Group     BP 05/17/23 1353 (!) 75/62     Systolic BP Percentile --      Diastolic BP Percentile --      Pulse Rate 05/17/23 1353 81     Resp 05/17/23 1353 20     Temp 05/17/23 1353 98.3 F (36.8 C)     Temp Source 05/17/23 1353 Oral     SpO2 05/17/23 1353 96 %     Weight 05/17/23 1347 171 lb (77.6 kg)     Height 05/17/23 1347 5' 8 (1.727 m)     Head Circumference --      Peak Flow --      Pain Score 05/17/23 1347 5     Pain Loc --      Pain Education --      Exclude from Growth Chart --     Most recent vital signs: Vitals:   05/17/23 1445 05/17/23 1500  BP: 113/74 109/77  Pulse:  83  Resp: 15 16  Temp:    SpO2:  99%    General: Awake, no distress.  CV:  Good peripheral perfusion.  Regular rate and rhythm, no murmurs, normal distal pulses Resp:  Normal effort.  Clear lungs Abd:  No distention.  Soft nontender Other:  Anterior chest wall  somewhat tender to the touch over the left medial pectoralis   ED Results / Procedures / Treatments   Labs (all labs ordered are listed, but only abnormal results are displayed) Labs Reviewed  BASIC METABOLIC PANEL - Abnormal; Notable for the following components:      Result Value   Glucose, Bld 105 (*)    All other components within normal limits  CBC - Abnormal; Notable for the following components:   RBC 3.85 (*)    Hemoglobin 11.5 (*)    HCT 34.5 (*)    Platelets 471 (*)    All other components within normal limits  TROPONIN I (HIGH SENSITIVITY)  TROPONIN I (HIGH SENSITIVITY)     EKG EKG interpreted by me Normal sinus rhythm, rate of 87.  Normal axis intervals QRS ST segments and T waves, overall normal EKG.   RADIOLOGY Chest x-ray interpreted by me, unremarkable.  Radiology report reviewed   PROCEDURES:  Procedures   MEDICATIONS ORDERED IN ED: Medications  sodium chloride  0.9 % bolus 1,000 mL (1,000 mLs Intravenous New Bag/Given 05/17/23 1401)  ondansetron  (ZOFRAN ) injection 4 mg (4 mg Intravenous Given  05/17/23 1358)     IMPRESSION / MDM / ASSESSMENT AND PLAN / ED COURSE  I reviewed the triage vital signs and the nursing notes.  DDx: Anxiety, OTC medication side effect, dehydration, non-STEMI.  Doubt PE, dissection, aneurysm, unstable angina, pericardial effusion  Patient's presentation is most consistent with acute presentation with potential threat to life or bodily function.  Patient presents with atypical chest discomfort, low suspicion for ACS or other acute cardiovascular/pulmonary issue.  Symptoms are improving with hydration and removal of nitroglycerin paste from his chest wall.  Initial EKG chest x-ray and labs are normal.  Will repeat troponin, if trend is flat, he will be stable for discharge  ----------------------------------------- 4:32 PM on 05/17/2023 ----------------------------------------- Repeat troponin normal.  Cardiology referral  ordered.  Stable for discharge.  Considering the patient's symptoms, medical history, and physical examination today, I have low suspicion for ACS, PE, TAD, pneumothorax, carditis, mediastinitis, pneumonia, CHF, or sepsis.      FINAL CLINICAL IMPRESSION(S) / ED DIAGNOSES   Final diagnoses:  Atypical chest pain     Rx / DC Orders   ED Discharge Orders          Ordered    Ambulatory referral to Cardiology       Comments: If you have not heard from the Cardiology office within the next 72 hours please call [phone].   05/17/23 1631             Note:  This document was prepared using Dragon voice recognition software and may include unintentional dictation errors.   Viviann Pastor, MD 05/17/23 [id number]

## 2023-07-10 NOTE — ED Provider Notes (Signed)
 Received sign out from Dr. Cleotilde  ED I-PASS Handoff Illness Severit: stable Patient Summary: Dylan Hayes is a 43 y.o. male with post op infection concern Action List:  Situation Awareness (Contingency Planning): Anticipate discharge. If ortho ok for dc,, then discharge Synthesis by Receiver  Ortho cleared. Recommen PO abx and f/up clinic.

## 2023-07-10 NOTE — ED Provider Notes (Signed)
 [facility] Emergency Department Provider Note   HPI   Dylan Hayes is a 43 y.o. male with past medical history of chronic hepatitis, Sjogren's, HLD, and HTN who presents for ankle pain. Patient reports right ankle pain starting 1 week ago with associated subjective fevers. He has been taking Ibuprofen  4 times a day without relief of his symptoms. He recently broke his right ankle 1.5 months ago, which was repaired by [facility]. He is currently not able to bear weight on his right ankle, and is ambulatory with crutches. No other recent falls or traumas.   Per chart review, patient was seen at [facility] 1.5 months ago (05/26/2023) for right ankle pain, at which time he was found to have a closed right trimalleolar fracture with malunion, and underwent a repair of malunion without graft. Patient was then seen by [facility] 1 month ago (06/14/2023) for cellulitis of the right lower extremity, and he was discharged with doxycycline.   Physical Exam   BP 122/89   Pulse 90   Resp 16   SpO2 97%    Constitutional: In no acute distress.  Intermittently somnolent.  Eyes: Conjunctivae are normal. EOMI.  HEENT: Normocephalic and atraumatic. Mucous membranes are moist.  Neck: Full active range of motion Cardiovascular: See heart rate listed above.  No murmurs appreciated.  Normal skin perfusion.  Respiratory: See respiratory rate listed above.  Lungs are clear to auscultation bilaterally.  Speaking easily in full sentences Gastrointestinal: Soft, non-distended, non-tender.  No guarding. Musculoskeletal: No long bone deformities. Erythema and warmth at the posterior / lateral ankle extending proximally to the distal shin. Minimal pain with passive ROM of the right ankle. 2+ DP and PT pulses. Surgical incisions appear to be healing well by secondary intent.  Neurologic: Normal speech and language.  Extraocular movements intact without nystagmus. Pupils are equal, round, and reactive  bilaterally. Cranial nerves II through XII intact. Strength 5/5 in bilateral upper and lower extremities. Distal sensation is intact in bilateral upper and lower extremities to light touch. Finger-to-nose testing normal. Negative Rhomberg. Normal, steady gait.   Skin: Skin is warm, dry and intact. Psychiatric: Mood and affect are normal.      MDM   Patient presents with acute on chronic right lower extremity pain in the setting of a closed right trimalleolar fracture with malunion that underwent fixation in February 2025. There is mild warmth, edema, and erythema at the incision sites.  There is no significant purulence or fluctuance noted.  Patient has been afebrile in the emergency department.  Will obtain the diagnostic workup noted below, including x-ray of the ankle to assess for hardware damage, additional fracture, osteomyelitis.  Position per pending diagnostics and reassessment.  Anticipate if patient has negative inflammatory markers and no other concern for acute infection he can be safely discharged to follow-up with his orthopedic team in the outpatient setting.  Orders Placed This Encounter  Procedures  . XR Ankle 3 or More Views Right  . CBC w/ Differential  . Comprehensive Metabolic Panel  . C-reactive protein  . Sedimentation rate, manual  . PT-INR  . Assess  . Elevate Extremity  . Notify Provider    ED Course as of 07/10/23 0854  Sun Jul 10, 2023  0638 CBC w/ Differential(!):   WBC 10.6  RBC 4.35  HGB 12.9  HCT 38.1(!)  MCV 87.6  MCH 29.7  MCHC 33.9  RDW 12.9  MPV 8.9  Platelet 330  nRBC 0  Neutrophils %  53.1  Lymphocytes % 32.5  Monocytes % 8.7  Eosinophils % 4.6  Basophils % 1.1  Absolute Neutrophils 5.6  Absolute Lymphocytes 3.4  Absolute Monocytes  0.9(!)  Absolute Eosinophils 0.5  Absolute Basophils  0.1  0638 Comprehensive Metabolic Panel(!):   Sodium 143  Potassium 3.9  Chloride 103  CO2 31.2(!)  Anion Gap 9  Bun 17  Creatinine 0.64(!)   BUN/Creatinine Ratio 27  eGFR CKD-EPI (2021) Male >90  Glucose 103  Calcium 9.9  Albumin 3.8  Total Protein 7.3  Total Bilirubin <0.2(!)  SGOT (AST) 17  ALT 21  Alkaline Phosphatase 106  0638 Sedimentation rate, manual:   Sed Rate 12  0638 C-reactive protein:   CRP <5.0  0638 PT-INR:   PT 11.7  INR 1.03  0652 XR Ankle 3 or More Views Right IMPRESSION: -Interval screw and plate fixation of the posterior and medial malleolus fractures without adverse hardware features. --Interval osseous bridging of the distal fibula, medial malleolus, and posterior malleolus fractures.   9256 POCT Glucose:   Glucose, POC 119  0853 Discussed the case with orthopedics.  They will discuss disposition with their team and provide recommendations.  Report provided to oncoming attending physician.  Final disposition per orthopedic recommendations.    The case was discussed with the attending physician who is in agreement with the above assessment and plan.  - Any discussion of this patient's case/presentation between myself and consultants, admitting teams, or other team members has been documented above. - Imaging and other studies, if performed, that were available during my care of the patient were independently reviewed and interpreted by me and considered in my medical decision making as documented above. - External records reviewed: 06/13/3033 Orthopedics Note. - Consideration of admission, observation, transfer, or escalation of care: Discharge   ED Clinical Impression   Final diagnoses:  Acute right ankle pain (Primary)  Closed right trimalleolar fracture, with malunion, subsequent encounter     Past History   PAST MEDICAL HISTORY/PAST SURGICAL HISTORY:  Past Medical History:  Diagnosis Date  . Back pain   . Chronic hepatitis   . Constipation   . Hx of TB skin testing 2023   negative 2/237/23  . Hypertension   . Keratoconjunctivitis sicca not specified as Sjogren's   . Mixed  hyperlipidemia   . Pain in joint, ankle and foot   . Unspecified hemorrhoids     Past Surgical History:  Procedure Laterality Date  . PR RNON/MALUNION TIBIA Right 05/26/2023   Procedure: REPAIR OF NONUNION OR MALUNION, TIBIA; WITHOUT GRAFT, (EG, COMPRESSION TECHNIQUE) x 2;  Surgeon: Tisa Fonda Mulch, MD;  Location: [facility];  Service: Orthopedics  . pt denies      MEDICATIONS:  No current facility-administered medications for this encounter.  Current Outpatient Medications:  .  aspirin (ECOTRIN) 81 MG tablet, Take 1 tablet (81 mg total) by mouth daily., Disp: 30 tablet, Rfl: 0 .  doxycycline (MONODOX) 100 MG capsule, Take 1 capsule (100 mg total) by mouth two (2) times a day., Disp: 28 capsule, Rfl: 0 .  ergocalciferol-1,250 mcg, 50,000 unit, (DRISDOL) 1,250 mcg (50,000 unit) capsule, Take 1 capsule (1,250 mcg total) by mouth once a week for 8 doses., Disp: 8 capsule, Rfl: 0 .  gabapentin (NEURONTIN) 100 MG capsule, Take 1 capsule (100 mg total) by mouth Three (3) times a day for 3 days., Disp: 9 capsule, Rfl: 0 .  hydroCHLOROthiazide (HYDRODIURIL) 25 MG tablet, Take 1 tablet (25 mg total)  by mouth daily. (Patient not taking: Reported on 05/19/2023), Disp: , Rfl:  .  HYDROcodone -acetaminophen  (NORCO) 5-325 mg per tablet, Take 1 tablet by mouth every eight (8) hours as needed for pain., Disp: 21 tablet, Rfl: 0 .  hydrOXYzine (ATARAX) 25 MG tablet, Take 1 tablet (25 mg total) by mouth every six (6) hours as needed for anxiety for up to 60 doses. (Patient not taking: Reported on 05/19/2023), Disp: 60 tablet, Rfl: 0 .  nicotine (NICODERM CQ) 14 mg/24 hr patch, Place 1 patch on the skin daily. (Patient not taking: Reported on 05/19/2023), Disp: 28 patch, Rfl: 0  ALLERGIES:  Aspirin and Sulfa (sulfonamide antibiotics)  SOCIAL HISTORY:  Social History   Tobacco Use  . Smoking status: Every Day    Current packs/day: 0.50    Types: Cigarettes  . Smokeless tobacco: Never  Substance  Use Topics  . Alcohol use: No    FAMILY HISTORY: No family history on file.   Vitals   Vitals:   07/10/23 0259 07/10/23 0728  BP: 144/92 122/89  Pulse: 106 90  Resp: 19 16  TempSrc: Oral   SpO2: 99% 97%      Radiology   XR Ankle 3 or More Views Right  Final Result  -Interval screw and plate fixation of the posterior and medial malleolus fractures without adverse hardware features.  --Interval osseous bridging of the distal fibula, medial malleolus, and posterior malleolus fractures.           Laboratory Data   Lab Results  Component Value Date   WBC 10.6 07/10/2023   HGB 12.9 07/10/2023   HCT 38.1 (L) 07/10/2023   PLT 330 07/10/2023    Lab Results  Component Value Date   NA 143 07/10/2023   K 3.9 07/10/2023   CL 103 07/10/2023   CO2 31.2 (H) 07/10/2023   BUN 17 07/10/2023   CREATININE 0.64 (L) 07/10/2023   GLU 103 07/10/2023   CALCIUM 9.9 07/10/2023    Lab Results  Component Value Date   BILITOT <0.2 (L) 07/10/2023   PROT 7.3 07/10/2023   ALBUMIN 3.8 07/10/2023   ALT 21 07/10/2023   AST 17 07/10/2023   ALKPHOS 106 07/10/2023    Lab Results  Component Value Date   INR 1.03 07/10/2023    Portions of this record have been created using Scientist, clinical (histocompatibility and immunogenetics). Dictation errors have been sought, but may not have been identified and corrected.  Documentation assistance was provided by Max Caza, Scribe on July 10, 2023 at 4:43 AM for Norleen Ernst, MD.  July 10, 2023 7:51 AM. Documentation assistance provided by the scribe. I was present during the time the encounter was recorded. The information recorded by the scribe was done at my direction and has been reviewed and validated by me.        Ernst Norleen LABOR, MD Resident 07/10/23 [phone]

## 2023-08-04 ENCOUNTER — Ambulatory Visit: Payer: Medicaid Other | Attending: Cardiology | Admitting: Cardiology

## 2024-02-01 ENCOUNTER — Emergency Department
Admission: EM | Admit: 2024-02-01 | Discharge: 2024-02-01 | Disposition: A | Discharge status: Home / Self Care | Attending: Emergency Medicine | Admitting: Emergency Medicine

## 2024-02-01 ENCOUNTER — Encounter: Payer: Self-pay | Admitting: Intensive Care

## 2024-02-01 ENCOUNTER — Emergency Department

## 2024-02-01 ENCOUNTER — Other Ambulatory Visit: Payer: Self-pay

## 2024-02-01 DIAGNOSIS — R110 Nausea: Secondary | ICD-10-CM | POA: Diagnosis not present

## 2024-02-01 DIAGNOSIS — E876 Hypokalemia: Secondary | ICD-10-CM | POA: Diagnosis not present

## 2024-02-01 DIAGNOSIS — R0789 Other chest pain: Secondary | ICD-10-CM | POA: Diagnosis present

## 2024-02-01 DIAGNOSIS — I10 Essential (primary) hypertension: Secondary | ICD-10-CM | POA: Diagnosis not present

## 2024-02-01 HISTORY — DX: Pure hypercholesterolemia, unspecified: E78.00

## 2024-02-01 LAB — CBC
HCT: 43.3 % (ref 39.0–52.0)
Hemoglobin: 14.5 g/dL (ref 13.0–17.0)
MCH: 29.7 pg (ref 26.0–34.0)
MCHC: 33.5 g/dL (ref 30.0–36.0)
MCV: 88.5 fL (ref 80.0–100.0)
Platelets: 351 K/uL (ref 150–400)
RBC: 4.89 MIL/uL (ref 4.22–5.81)
RDW: 12.7 % (ref 11.5–15.5)
WBC: 10.1 K/uL (ref 4.0–10.5)
nRBC: 0 % (ref 0.0–0.2)

## 2024-02-01 LAB — BASIC METABOLIC PANEL WITH GFR
Anion gap: 8 (ref 5–15)
BUN: 18 mg/dL (ref 6–20)
CO2: 29 mmol/L (ref 22–32)
Calcium: 9.3 mg/dL (ref 8.9–10.3)
Chloride: 102 mmol/L (ref 98–111)
Creatinine, Ser: 0.77 mg/dL (ref 0.61–1.24)
GFR, Estimated: >60 mL/min (ref >60)
Glucose, Bld: 103 mg/dL — ABNORMAL HIGH (ref 70–99)
Potassium: 3.4 mmol/L — ABNORMAL LOW (ref 3.5–5.1)
Sodium: 139 mmol/L (ref 135–145)

## 2024-02-01 LAB — TROPONIN I (HIGH SENSITIVITY)
Troponin I (High Sensitivity): 4 ng/L (ref <18)
Troponin I (High Sensitivity): 5 ng/L (ref <18)

## 2024-02-01 MED ORDER — METOCLOPRAMIDE HCL 10 MG PO TABS
10.0000 mg | ORAL_TABLET | Freq: Once | ORAL | Status: AC
  Administered 2024-02-01: 10 mg via ORAL
  Filled 2024-02-01: qty 1

## 2024-02-01 MED ORDER — METOCLOPRAMIDE HCL 10 MG PO TABS: 10.0000 mg | ORAL_TABLET | Freq: Three times a day (TID) | ORAL | 0 refills | Status: AC | PRN

## 2024-02-01 MED ORDER — DOCUSATE SODIUM 100 MG PO CAPS: 100.0000 mg | ORAL_CAPSULE | Freq: Two times a day (BID) | ORAL | 0 refills | Status: AC | PRN

## 2024-02-01 NOTE — ED Triage Notes (Signed)
 Patient c/o central chest tightness that started this AM. Reports nausea.

## 2024-02-01 NOTE — ED Provider Notes (Signed)
 [facility] Provider Note    Event Date/Time   First MD Initiated Contact with Patient 02/01/24 1129     (approximate)   History   Chest Pain   HPI  Dylan Hayes is a 43 y.o. male with history of chronic hepatitis, Sjogren syndrome, hypertension and hyperlipidemia who presents with nausea and chest discomfort.  The patient states that he woke around 4 5 AM with nausea which has persisted.  He also had chest discomfort described as tightness.  This has now subsided.  He states that the pain was lower chest/upper epigastric area.  He reported some associated shortness of breath and lightheadedness.  He denies any cough or fever.  He has not had any vomiting or diarrhea.  He states he ate cookout last night, nothing out of the ordinary.  I reviewed the past medical records.  The patient's most recent outpatient counter was on 3/13 at the [facility] ED with ankle pain.     Physical Exam   Triage Vital Signs: ED Triage Vitals  Encounter Vitals Group     BP 02/01/24 1011 (!) 166/107     Girls Systolic BP Percentile --      Girls Diastolic BP Percentile --      Boys Systolic BP Percentile --      Boys Diastolic BP Percentile --      Pulse Rate 02/01/24 1011 86     Resp 02/01/24 1011 16     Temp 02/01/24 1011 98.1 F (36.7 C)     Temp Source 02/01/24 1011 Oral     SpO2 02/01/24 1011 97 %     Weight 02/01/24 1010 170 lb (77.1 kg)     Height 02/01/24 1010 5' 8 (1.727 m)     Head Circumference --      Peak Flow --      Pain Score 02/01/24 1010 3     Pain Loc --      Pain Education --      Exclude from Growth Chart --     Most recent vital signs: Vitals:   02/01/24 1011 02/01/24 1243  BP: (!) 166/107 (!) 139/96  Pulse: 86 84  Resp: 16 16  Temp: 98.1 F (36.7 C)   SpO2: 97% 98%     General: Alert, relatively well-appearing, no distress.  CV:  Good peripheral perfusion.  Normal heart sounds. Resp:  Normal effort.  Lungs CTAB. Abd:  No distention.   Other:  No peripheral edema.   ED Results / Procedures / Treatments   Labs (all labs ordered are listed, but only abnormal results are displayed) Labs Reviewed  BASIC METABOLIC PANEL WITH GFR - Abnormal; Notable for the following components:      Result Value   Potassium 3.4 (*)    Glucose, Bld 103 (*)    All other components within normal limits  CBC  TROPONIN I (HIGH SENSITIVITY)  TROPONIN I (HIGH SENSITIVITY)     EKG  ED ECG REPORT I, Waylon Cassis, the attending physician, personally viewed and interpreted this ECG.  Date: 02/01/2024 EKG Time: 1010 Rate: 94 Rhythm: normal sinus rhythm QRS Axis: normal Intervals: normal ST/T Wave abnormalities: normal Narrative Interpretation: no evidence of acute ischemia    RADIOLOGY  Chest x-ray: I independently viewed and interpreted the images; there is no focal consolidation or edema  PROCEDURES:  Critical Care performed: No  Procedures   MEDICATIONS ORDERED IN ED: Medications  metoCLOPramide (REGLAN) tablet 10 mg (10  mg Oral Given 02/01/24 1238)     IMPRESSION / MDM / ASSESSMENT AND PLAN / ED COURSE  I reviewed the triage vital signs and the nursing notes.  43 year old male with PMH as noted above presents with persistent nausea and resolved chest discomfort that started early this morning.  Physical exam is unremarkable for acute findings.  Vital signs are normal.  EKG is nonischemic.  Differential diagnosis includes, but is not limited to, GERD, gastroenteritis, gastroparesis, other GI etiology, musculoskeletal pain, less likely ACS or other cardiac etiology.  There is no clinical evidence for PE given the resolved chest discomfort and normal vital signs.  Patient's presentation is most consistent with acute complicated illness / injury requiring diagnostic workup.  BMP and CBC show no acute findings.  Initial troponin is negative.  Chest x-ray is clear.  We will give p.o. Reglan, obtain repeat troponin,  and reassess.   ----------------------------------------- 2:27 PM on 02/01/2024 -----------------------------------------  Repeat troponin is negative.  The patient reports significant proved in his nausea with the Reglan.  He is tolerating p.o.  He is stable for discharge at this time.  I counseled him on the results of the workup.  I gave strict return precautions, and he expressed understanding.   FINAL CLINICAL IMPRESSION(S) / ED DIAGNOSES   Final diagnoses:  Atypical chest pain  Nausea     Rx / DC Orders   ED Discharge Orders          Ordered    docusate sodium (COLACE) 100 MG capsule  2 times daily PRN        02/01/24 1357    metoCLOPramide (REGLAN) 10 MG tablet  Every 8 hours PRN        02/01/24 1357             Note:  This document was prepared using Dragon voice recognition software and may include unintentional dictation errors.    Jacolyn Pae, MD 02/01/24 1427

## 2024-02-01 NOTE — Discharge Instructions (Addendum)
 You may take the Reglan as needed for nausea.  Start taking the Colace to soften your stool and help with your constipation.  Return to the ER for new, worsening, or persistent severe chest pain or discomfort, difficulty breathing, nausea or vomiting, or any other new or worsening symptoms that concern you.
